# Patient Record
Sex: Female | Born: 1960 | Race: White | Hispanic: No | Marital: Single | Smoking: Never smoker
Health system: Southern US, Community
[De-identification: ages and names within clinical notes are randomized; demographics above are authoritative.]

## PROBLEM LIST (undated history)

## (undated) DIAGNOSIS — F419 Anxiety disorder, unspecified: Secondary | ICD-10-CM

## (undated) DIAGNOSIS — E079 Disorder of thyroid, unspecified: Secondary | ICD-10-CM

## (undated) HISTORY — PX: JOINT REPLACEMENT: SHX530

## (undated) HISTORY — PX: APPENDECTOMY: SHX54

---

## 2012-06-07 ENCOUNTER — Emergency Department (HOSPITAL_BASED_OUTPATIENT_CLINIC_OR_DEPARTMENT_OTHER)
Admission: EM | Admit: 2012-06-07 | Discharge: 2012-06-07 | Disposition: A | Discharge status: Home / Self Care | Payer: No Typology Code available for payment source | Attending: Emergency Medicine | Admitting: Emergency Medicine

## 2012-06-07 ENCOUNTER — Encounter (HOSPITAL_BASED_OUTPATIENT_CLINIC_OR_DEPARTMENT_OTHER): Payer: Self-pay | Admitting: *Deleted

## 2012-06-07 DIAGNOSIS — T148XXA Other injury of unspecified body region, initial encounter: Secondary | ICD-10-CM

## 2012-06-07 DIAGNOSIS — E079 Disorder of thyroid, unspecified: Secondary | ICD-10-CM | POA: Insufficient documentation

## 2012-06-07 DIAGNOSIS — M549 Dorsalgia, unspecified: Secondary | ICD-10-CM | POA: Insufficient documentation

## 2012-06-07 DIAGNOSIS — Z79899 Other long term (current) drug therapy: Secondary | ICD-10-CM | POA: Insufficient documentation

## 2012-06-07 DIAGNOSIS — M542 Cervicalgia: Secondary | ICD-10-CM | POA: Insufficient documentation

## 2012-06-07 DIAGNOSIS — Z885 Allergy status to narcotic agent status: Secondary | ICD-10-CM | POA: Insufficient documentation

## 2012-06-07 DIAGNOSIS — F411 Generalized anxiety disorder: Secondary | ICD-10-CM | POA: Insufficient documentation

## 2012-06-07 HISTORY — DX: Anxiety disorder, unspecified: F41.9

## 2012-06-07 HISTORY — DX: Disorder of thyroid, unspecified: E07.9

## 2012-06-07 MED ORDER — METHOCARBAMOL 500 MG PO TABS: 500.0000 mg | ORAL_TABLET | Freq: Two times a day (BID) | ORAL | Status: AC

## 2012-06-07 MED ORDER — HYDROCODONE-ACETAMINOPHEN 5-325 MG PO TABS
1.0000 | ORAL_TABLET | Freq: Once | ORAL | Status: AC
  Administered 2012-06-07: 1 via ORAL
  Filled 2012-06-07: qty 1

## 2012-06-07 MED ORDER — IBUPROFEN 600 MG PO TABS: 600.0000 mg | ORAL_TABLET | Freq: Four times a day (QID) | ORAL | Status: AC | PRN

## 2012-06-07 MED ORDER — METHOCARBAMOL 500 MG PO TABS
1000.0000 mg | ORAL_TABLET | Freq: Once | ORAL | Status: AC
  Administered 2012-06-07: 1000 mg via ORAL
  Filled 2012-06-07: qty 2

## 2012-06-07 MED ORDER — IBUPROFEN 200 MG PO TABS
600.0000 mg | ORAL_TABLET | Freq: Once | ORAL | Status: AC
  Administered 2012-06-07: 600 mg via ORAL
  Filled 2012-06-07: qty 1
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
## 2012-06-07 NOTE — Discharge Instructions (Signed)
Muscle Strain A muscle strain, or pulled muscle, occurs when a muscle is over-stretched. A small number of muscle fibers may also be torn. This is especially common in athletes. This happens when a sudden violent force placed on a muscle pushes it past its capacity. Usually, recovery from a pulled muscle takes 1 to 2 weeks. But complete healing will take 5 to 6 weeks. There are millions of muscle fibers. Following injury, your body will usually return to normal quickly. HOME CARE INSTRUCTIONS   While awake, apply ice to the sore muscle for 15 to 20 minutes each hour for the first 2 days. Put ice in a plastic bag and place a towel between the bag of ice and your skin.   Do not use the pulled muscle for several days. Do not use the muscle if you have pain.   You may wrap the injured area with an elastic bandage for comfort. Be careful not to bind it too tightly. This may interfere with blood circulation.   Only take over-the-counter or prescription medicines for pain, discomfort, or fever as directed by your caregiver. Do not use aspirin as this will increase bleeding (bruising) at injury site.   Warming up before exercise helps prevent muscle strains.  SEEK MEDICAL CARE IF:  There is increased pain or swelling in the affected area. MAKE SURE YOU:   Understand these instructions.   Will watch your condition.   Will get help right away if you are not doing well or get worse.  Document Released: 11/29/2005 Document Revised: 11/18/2011 Document Reviewed: 06/28/2007 [employer] Patient Information 2012 [employer]. 

## 2015-05-22 ENCOUNTER — Other Ambulatory Visit: Payer: Self-pay | Admitting: Physician Assistant

## 2015-05-22 DIAGNOSIS — Z1231 Encounter for screening mammogram for malignant neoplasm of breast: Secondary | ICD-10-CM

## 2015-06-18 ENCOUNTER — Ambulatory Visit: Payer: Self-pay

## 2015-10-08 ENCOUNTER — Other Ambulatory Visit: Payer: Self-pay

## 2015-10-08 DIAGNOSIS — Z1231 Encounter for screening mammogram for malignant neoplasm of breast: Secondary | ICD-10-CM

## 2015-12-10 ENCOUNTER — Ambulatory Visit
Admission: RE | Admit: 2015-12-10 | Discharge: 2015-12-10 | Disposition: A | Discharge status: Home / Self Care | Payer: 59 | Source: Ambulatory Visit

## 2015-12-10 DIAGNOSIS — Z1231 Encounter for screening mammogram for malignant neoplasm of breast: Secondary | ICD-10-CM

## 2017-10-14 ENCOUNTER — Ambulatory Visit (INDEPENDENT_AMBULATORY_CARE_PROVIDER_SITE_OTHER): Payer: BLUE CROSS/BLUE SHIELD | Admitting: Podiatry

## 2017-10-14 ENCOUNTER — Ambulatory Visit (INDEPENDENT_AMBULATORY_CARE_PROVIDER_SITE_OTHER): Payer: BLUE CROSS/BLUE SHIELD

## 2017-10-14 DIAGNOSIS — M2011 Hallux valgus (acquired), right foot: Secondary | ICD-10-CM

## 2017-10-14 DIAGNOSIS — M216X2 Other acquired deformities of left foot: Secondary | ICD-10-CM | POA: Diagnosis not present

## 2017-10-14 DIAGNOSIS — M79672 Pain in left foot: Secondary | ICD-10-CM | POA: Diagnosis not present

## 2017-10-14 NOTE — Progress Notes (Signed)
Subjective:    Patient ID: Marie Miranda, female    DOB: [birth year], 56 y.o.   MRN: [id number]  HPI 56 year old female presents the office today for concerns of her left second toe "collapsing" and turning and works which is been ongoing since she had bunion surgery her left foot in 2009. She states the pain at times becomes 8/10. She's been taping the toe which does help. She denies any recent injury or trauma to the area. She is also starting to have some discomfort of the bunion on the right side but she does not want to undergo surgery to the right foot. She denies any recent injury or trauma to her feet and she denies any swelling or redness. She has no other concerns today.   Review of Systems  All other systems reviewed and are negative.  Past Medical History:  Diagnosis Date  . Anxiety   . Thyroid disease     Past Surgical History:  Procedure Laterality Date  . APPENDECTOMY    . JOINT REPLACEMENT       Current Outpatient Medications:  .  albuterol (PROVENTIL HFA;VENTOLIN HFA) 108 (90 BASE) MCG/ACT inhaler, Inhale 2 puffs into the lungs every 6 (six) hours as needed., Disp: , Rfl:  .  clonazePAM (KLONOPIN) 0.5 MG tablet, Take 0.5 mg by mouth 2 (two) times daily as needed., Disp: , Rfl:  .  FLUoxetine (PROZAC) 20 MG capsule, Take 20 mg by mouth daily., Disp: , Rfl:  .  fluticasone-salmeterol (ADVAIR HFA) 115-21 MCG/ACT inhaler, Inhale 1 puff into the lungs 2 (two) times daily., Disp: , Rfl:  .  levothyroxine (SYNTHROID, LEVOTHROID) 50 MCG tablet, Take 50 mcg by mouth daily., Disp: , Rfl:   Allergies  Allergen Reactions  . Vicodin [Hydrocodone-Acetaminophen] Other (See Comments)    Medication causes patient to itch.    Social History   Socioeconomic History  . Marital status: Single    Spouse name: Not on file  . Number of children: Not on file  . Years of education: Not on file  . Highest education level: Not on file  Social Needs  . Financial resource  strain: Not on file  . Food insecurity - worry: Not on file  . Food insecurity - inability: Not on file  . Transportation needs - medical: Not on file  . Transportation needs - non-medical: Not on file  Occupational History  . Not on file  Tobacco Use  . Smoking status: Never Smoker  Substance and Sexual Activity  . Alcohol use: No  . Drug use: Not on file  . Sexual activity: Yes    Birth control/protection: None  Other Topics Concern  . Not on file  Social History Narrative  . Not on file        Objective:   Physical Exam  General: AAO x3, NAD  Dermatological: Skin is warm, dry and supple bilateral. Nails x 10 are well manicured; remaining integument appears unremarkable at this time. There are no open sores, no preulcerative lesions, no rash or signs of infection present.  Vascular: Dorsalis Pedis artery and Posterior Tibial artery pedal pulses are 2/4 bilateral with immedate capillary fill time There is no pain with calf compression, swelling, warmth, erythema.   Neruologic: Grossly intact via light touch bilateral.  Protective threshold with Semmes Wienstein monofilament intact to all pedal sites bilateral.   Musculoskeletal: Hallux is in rectus position. There is prominence the second metatarsal head plantarly in the left foot with mild to  palpation. The second toe is sitting deviated in the transverse plane as well. Subjective tenderness to palpation submetatarsal 2 into the second toe on the left foot. No pain bunion surgery site. On the right foot there is mild HAV present mild transverse plane deformity to the second digit as well. There is no other areas of tenderness of the foot at this time. Muscular strength 5/5 in all groups tested bilateral.  Gait: Unassisted, Nonantalgic.     Assessment & Plan:  56 year old female with prominent second metatarsal head, elongated second metatarsal with transverse plane deformity; mild bunion right foot -Treatment options  discussed including all alternatives, risks, and complications -Etiology of symptoms were discussed -X-rays were obtained and reviewed with the patient. Discussed elongated second metatarsal. There is no evidence of acute fracture identified. Deformity present to the second digit. The long discussion regards the left foot in regards to both conservative as well as surgical treatment options. Conservatively we discussed a custom insert with a modification to offload the second metatarsal head. Discussed supportive shoes and change in shoes. Surgically we discussed second metatarsal shortening osteotomy to help decrease the prominence of the metatarsal to help shorten metatarsal. She denies twice a day of doing an arthrodesis or arthroplasty of the second digit I think we can get by with pinning of the second toe through the MPJ. Discussed the surgery as well as postoperative course. She was a think about her options. -Regards the mild bunion right foot discussed shoe gear modifications, inserts, offloading padding.  Ovid CurdMatthew Wagoner, DPM -

## 2017-10-14 NOTE — Patient Instructions (Signed)

## 2018-08-23 ENCOUNTER — Other Ambulatory Visit: Payer: Self-pay | Admitting: Family Medicine

## 2018-08-23 DIAGNOSIS — Z1231 Encounter for screening mammogram for malignant neoplasm of breast: Secondary | ICD-10-CM

## 2018-09-19 ENCOUNTER — Ambulatory Visit
Admission: RE | Admit: 2018-09-19 | Discharge: 2018-09-19 | Disposition: A | Discharge status: Home / Self Care | Payer: BLUE CROSS/BLUE SHIELD | Source: Ambulatory Visit | Attending: Family Medicine | Admitting: Family Medicine

## 2018-09-19 DIAGNOSIS — Z1231 Encounter for screening mammogram for malignant neoplasm of breast: Secondary | ICD-10-CM

## 2019-10-26 ENCOUNTER — Other Ambulatory Visit: Payer: Self-pay | Admitting: Family Medicine

## 2019-10-26 DIAGNOSIS — Z1231 Encounter for screening mammogram for malignant neoplasm of breast: Secondary | ICD-10-CM

## 2019-11-19 ENCOUNTER — Ambulatory Visit
Admission: RE | Admit: 2019-11-19 | Discharge: 2019-11-19 | Disposition: A | Discharge status: Home / Self Care | Payer: BLUE CROSS/BLUE SHIELD | Source: Ambulatory Visit | Attending: Family Medicine | Admitting: Family Medicine

## 2019-11-19 ENCOUNTER — Other Ambulatory Visit: Payer: Self-pay

## 2019-11-19 DIAGNOSIS — Z1231 Encounter for screening mammogram for malignant neoplasm of breast: Secondary | ICD-10-CM

## 2020-05-15 DIAGNOSIS — E039 Hypothyroidism, unspecified: Secondary | ICD-10-CM | POA: Diagnosis not present

## 2020-07-28 ENCOUNTER — Other Ambulatory Visit: Payer: Self-pay

## 2020-07-28 ENCOUNTER — Emergency Department (HOSPITAL_COMMUNITY): Payer: BLUE CROSS/BLUE SHIELD

## 2020-07-28 ENCOUNTER — Emergency Department (HOSPITAL_COMMUNITY)
Admission: EM | Admit: 2020-07-28 | Discharge: 2020-07-28 | Disposition: A | Discharge status: Home / Self Care | Payer: BLUE CROSS/BLUE SHIELD | Attending: Emergency Medicine | Admitting: Emergency Medicine

## 2020-07-28 DIAGNOSIS — S0990XA Unspecified injury of head, initial encounter: Secondary | ICD-10-CM | POA: Diagnosis not present

## 2020-07-28 DIAGNOSIS — R0602 Shortness of breath: Secondary | ICD-10-CM | POA: Diagnosis not present

## 2020-07-28 DIAGNOSIS — R404 Transient alteration of awareness: Secondary | ICD-10-CM | POA: Diagnosis not present

## 2020-07-28 DIAGNOSIS — R42 Dizziness and giddiness: Secondary | ICD-10-CM | POA: Insufficient documentation

## 2020-07-28 DIAGNOSIS — T7840XA Allergy, unspecified, initial encounter: Secondary | ICD-10-CM | POA: Insufficient documentation

## 2020-07-28 DIAGNOSIS — R55 Syncope and collapse: Secondary | ICD-10-CM | POA: Diagnosis not present

## 2020-07-28 DIAGNOSIS — R410 Disorientation, unspecified: Secondary | ICD-10-CM | POA: Diagnosis not present

## 2020-07-28 DIAGNOSIS — H748X1 Other specified disorders of right middle ear and mastoid: Secondary | ICD-10-CM | POA: Diagnosis not present

## 2020-07-28 LAB — CBC WITH DIFFERENTIAL/PLATELET
Abs Immature Granulocytes: 0.02 10*3/uL (ref 0.00–0.07)
Basophils Absolute: 0 10*3/uL (ref 0.0–0.1)
Basophils Relative: 0 %
Eosinophils Absolute: 0 10*3/uL (ref 0.0–0.5)
Eosinophils Relative: 0 %
HCT: 40.6 % (ref 36.0–46.0)
Hemoglobin: 13.9 g/dL (ref 12.0–15.0)
Immature Granulocytes: 0 %
Lymphocytes Relative: 9 %
Lymphs Abs: 0.9 10*3/uL (ref 0.7–4.0)
MCH: 32.3 pg (ref 26.0–34.0)
MCHC: 34.2 g/dL (ref 30.0–36.0)
MCV: 94.4 fL (ref 80.0–100.0)
Monocytes Absolute: 0.7 10*3/uL (ref 0.1–1.0)
Monocytes Relative: 8 %
Neutro Abs: 7.8 10*3/uL — ABNORMAL HIGH (ref 1.7–7.7)
Neutrophils Relative %: 83 %
Platelets: 265 10*3/uL (ref 150–400)
RBC: 4.3 MIL/uL (ref 3.87–5.11)
RDW: 11.4 % — ABNORMAL LOW (ref 11.5–15.5)
WBC: 9.5 10*3/uL (ref 4.0–10.5)
nRBC: 0 % (ref 0.0–0.2)

## 2020-07-28 LAB — URINALYSIS, ROUTINE W REFLEX MICROSCOPIC
Bilirubin Urine: NEGATIVE
Glucose, UA: NEGATIVE mg/dL
Hgb urine dipstick: NEGATIVE
Ketones, ur: NEGATIVE mg/dL
Leukocytes,Ua: NEGATIVE
Nitrite: NEGATIVE
Protein, ur: 30 mg/dL — AB
Specific Gravity, Urine: 1.017 (ref 1.005–1.030)
pH: 6 (ref 5.0–8.0)

## 2020-07-28 LAB — BASIC METABOLIC PANEL
Anion gap: 11 (ref 5–15)
BUN: 12 mg/dL (ref 6–20)
CO2: 25 mmol/L (ref 22–32)
Calcium: 8.8 mg/dL — ABNORMAL LOW (ref 8.9–10.3)
Chloride: 98 mmol/L (ref 98–111)
Creatinine, Ser: 0.87 mg/dL (ref 0.44–1.00)
GFR calc Af Amer: >60 mL/min (ref >60)
GFR calc non Af Amer: >60 mL/min (ref >60)
Glucose, Bld: 120 mg/dL — ABNORMAL HIGH (ref 70–99)
Potassium: 4.1 mmol/L (ref 3.5–5.1)
Sodium: 134 mmol/L — ABNORMAL LOW (ref 135–145)

## 2020-07-28 MED ORDER — EPINEPHRINE 0.3 MG/0.3ML IJ SOAJ: 0.3000 mg | INTRAMUSCULAR | 0 refills | Status: AC | PRN

## 2020-07-28 MED ORDER — FAMOTIDINE IN NACL 20-0.9 MG/50ML-% IV SOLN
20.0000 mg | Freq: Once | INTRAVENOUS | Status: AC
  Administered 2020-07-28: 20 mg via INTRAVENOUS
  Filled 2020-07-28: qty 50

## 2020-07-28 MED ORDER — METHYLPREDNISOLONE SODIUM SUCC 125 MG IJ SOLR
125.0000 mg | Freq: Once | INTRAMUSCULAR | Status: AC
  Administered 2020-07-28: 125 mg via INTRAVENOUS
  Filled 2020-07-28: qty 2

## 2020-07-28 NOTE — ED Triage Notes (Signed)
Pt presents via EMS from UC c/o allergic reaction. Pt given IM Epi x2 and IM Benadryl PTA. Pt A&Ox4.

## 2020-07-28 NOTE — ED Provider Notes (Signed)
[facility] EMERGENCY DEPARTMENT Provider Note   CSN: [id number] Arrival date & time: 07/28/20  1958     History Chief Complaint  Patient presents with   Allergic Reaction    Marie Miranda is a 59 y.o. female with PMHx anxiety and thyroid disease who presents to the ED today via EMS with complaint of allergic reaction. Per EMS pt ate a hamburger today around 2 PM and shortly afterwards began having diffuse itching and hives to her body. She took a claritin at home without relief prompting her to call her friend to come pick her up and take her to UC. She reports that while at home she "passed out" and fell to the ground and was out for a couple of seconds before coming to. She believes she hit her head on the floor; she is not anticoagulated. No complaints of headache currently. Pt reports she believes she passed out while on the way to urgent care as well however she is unsure if she truly passed out or not. States that she got very dizzy and lightheaded and could feel like she was going to pass out. No shortness of breath or chest pain.   Additional information obtained by friend whom drove patient to the UC - reports they live about 1-2 miles from the UC. Pt was doing fine and mentating well when he picked her up however shortly into the ride she "slumped over" and leaned on the driver's shoulder. When they got to UC he went to the side of the car to help her out; reports that 2 steps out of the car pt slumped over again however he was able to catch her. Friend does not believe pt fully passed out at any time. Per EMS pt was provided with benadryl and fluids at [facility] and then EMS was called given severity of patient's hives. EMS gave pt 2 rounds of epinephrine with improvement in her sx.   The history is provided by the patient, medical records, a friend and the EMS personnel.       Past Medical History:  Diagnosis Date   Anxiety    Thyroid disease     There  are no problems to display for this patient.   Past Surgical History:  Procedure Laterality Date   APPENDECTOMY     JOINT REPLACEMENT       OB History   No obstetric history on file.     Family History  Problem Relation Age of Onset   Breast cancer Paternal Grandmother 34    Social History   Tobacco Use   Smoking status: Never Smoker  Substance Use Topics   Alcohol use: No   Drug use: Not on file    Home Medications Prior to Admission medications   Medication Sig Start Date End Date Taking? Authorizing Provider  albuterol (PROVENTIL HFA;VENTOLIN HFA) 108 (90 BASE) MCG/ACT inhaler Inhale 2 puffs into the lungs every 6 (six) hours as needed for wheezing or shortness of breath.    Yes [provider]  clonazePAM (KLONOPIN) 0.5 MG tablet Take 0.5 mg by mouth 2 (two) times daily as needed for anxiety.    Yes [provider]  fexofenadine (ALLEGRA) 60 MG tablet Take 60 mg by mouth 2 (two) times daily.   Yes [provider]  FLUoxetine (PROZAC) 20 MG capsule Take 20 mg by mouth daily.   Yes [provider]  fluticasone (FLONASE) 50 MCG/ACT nasal spray Place 1 spray into both  nostrils daily. 05/15/20  Yes [provider]  fluticasone-salmeterol (ADVAIR HFA) 115-21 MCG/ACT inhaler Inhale 1 puff into the lungs as needed (breathing).    Yes [provider]  gabapentin (NEURONTIN) 300 MG capsule Take 300 mg by mouth as needed (back pain).  05/17/20  Yes [provider]  levothyroxine (SYNTHROID, LEVOTHROID) 50 MCG tablet Take 50 mcg by mouth daily.   Yes [provider]  magnesium 30 MG tablet Take 30 mg by mouth 2 (two) times daily.   Yes [provider]  thiamine (VITAMIN B-1) 100 MG tablet Take 100 mg by mouth daily.   Yes [provider]  EPINEPHrine 0.3 mg/0.3 mL IJ SOAJ injection Inject 0.3 mLs (0.3 mg total) into the muscle as needed for anaphylaxis. 07/28/20   Tanda Rockers, PA-C     Allergies    Vicodin [hydrocodone-acetaminophen]  Review of Systems   Review of Systems  Respiratory: Negative for shortness of breath.   Cardiovascular: Negative for chest pain.  Skin: Positive for rash.  Neurological: Positive for dizziness and light-headedness.  All other systems reviewed and are negative.   Physical Exam Updated Vital Signs BP 109/72    Pulse 73    Temp 98.1 F (36.7 C) (Oral)    Resp 16    SpO2 92%   Physical Exam Vitals and nursing note reviewed.  Constitutional:      Appearance: She is not ill-appearing or diaphoretic.     Comments: Pt shivering on arrival  HENT:     Head: Normocephalic.     Comments: Smell hematoma to occiput with mild TTP    Mouth/Throat:     Mouth: Mucous membranes are moist.     Pharynx: No oropharyngeal exudate or posterior oropharyngeal erythema.     Comments: Uvula is midline. Airway patent.  Eyes:     Extraocular Movements: Extraocular movements intact.     Conjunctiva/sclera: Conjunctivae normal.     Pupils: Pupils are equal, round, and reactive to light.  Cardiovascular:     Rate and Rhythm: Normal rate and regular rhythm.     Pulses: Normal pulses.  Pulmonary:     Effort: Pulmonary effort is normal.     Breath sounds: Normal breath sounds. No wheezing, rhonchi or rales.  Abdominal:     Palpations: Abdomen is soft.     Tenderness: There is no abdominal tenderness.  Musculoskeletal:     Cervical back: Neck supple. No tenderness.  Skin:    General: Skin is warm and dry.     Findings: Rash present.     Comments: Diffuse red urticarial rash (improving per EMS)  Neurological:     General: No focal deficit present.     Mental Status: She is alert and oriented to person, place, and time. Mental status is at baseline.     Cranial Nerves: No cranial nerve deficit.     ED Results / Procedures / Treatments   Labs (all labs ordered are listed, but only abnormal results are displayed) Labs Reviewed  BASIC METABOLIC  PANEL - Abnormal; Notable for the following components:      Result Value   Sodium 134 (*)    Glucose, Bld 120 (*)    Calcium 8.8 (*)    All other components within normal limits  CBC WITH DIFFERENTIAL/PLATELET - Abnormal; Notable for the following components:   RDW 11.4 (*)    Neutro Abs 7.8 (*)    All other components within normal limits  URINALYSIS, ROUTINE W  REFLEX MICROSCOPIC - Abnormal; Notable for the following components:   Protein, ur 30 (*)    Bacteria, UA RARE (*)    All other components within normal limits    EKG EKG Interpretation  Date/Time:  Monday July 28 2020 20:32:41 EDT Ventricular Rate:  85 PR Interval:    QRS Duration: 108 QT Interval:  404 QTC Calculation: 481 R Axis:   27 Text Interpretation: Sinus rhythm RSR' in V1 or V2, right VCD or RVH Borderline T abnormalities, anterior leads Baseline wander in lead(s) V6 No old tracing to compare Confirmed by Susy FrizzleSheldon, Charles [phone]) on 07/28/2020 10:32:09 PM   Radiology CT Head Wo Contrast  Result Date: 07/28/2020 CLINICAL DATA:  Syncope, fall striking head. EXAM: CT HEAD WITHOUT CONTRAST TECHNIQUE: Contiguous axial images were obtained from the base of the skull through the vertex without intravenous contrast. COMPARISON:  None. FINDINGS: Brain: Brain volume is normal for age. No intracranial hemorrhage, mass effect, or midline shift. No hydrocephalus. The basilar cisterns are patent. No evidence of territorial infarct or acute ischemia. No extra-axial or intracranial fluid collection. Vascular: No hyperdense vessel or unexpected calcification. Skull: No fracture or focal lesion. Sinuses/Orbits: Paranasal sinuses and mastoid air cells are clear. Right mastoid air cells are hypo pneumatized. The visualized orbits are unremarkable. Other: None. IMPRESSION: Negative noncontrast head CT. Electronically Signed   By: Narda RutherfordMelanie  Sanford M.D.   On: 07/28/2020 21:55    Procedures Procedures (including critical care  time)  Medications Ordered in ED Medications  methylPREDNISolone sodium succinate (SOLU-MEDROL) 125 mg/2 mL injection 125 mg (125 mg Intravenous Given 07/28/20 2026)  famotidine (PEPCID) IVPB 20 mg premix (0 mg Intravenous Stopped 07/28/20 2239)    ED Course  I have reviewed the triage vital signs and the nursing notes.  Pertinent labs & imaging results that were available during my care of the patient were reviewed by me and considered in my medical decision making (see chart for details).    MDM Rules/Calculators/A&P                          59 year old female who presents to the ED via EMS for allergic reaction. Began having hives after eating hamburger earlier today at work what appears to be 2-3 near syncopal episodes.  Patient is unsure if she fully passed out and from friends recollection does sound more like near syncope.  Patient states she could feel dizziness and lightheadedness prior to.  No chest pain or shortness of breath.  Patient was given Benadryl and fluids with urgent care and then 2 rounds of epinephrine with EMS.  On arrival to the ED patient is afebrile, nontachycardic nontachypneic.  She appears to be in no acute distress.  She is shivering and stating that she is cold.  Temp normal 98.1.  Patient does have an urticarial rash diffusely however per EMS they report that this has been improving since being en route.  Patient denies any shortness of breath or throat/lip swelling.  Airway intact.  Given questionable syncope versus near syncope will obtain labs, orthostatics, EKG.  Given patient hit her head will obtain CT head at this time.  We will continue to monitor in the ED for a couple of hours given she received 2 rounds of epinephrine.  She does report that she has been experiencing issues with hives in the past however it is never been this severe.  She does have an scheduled appointment with allergist next week,  advised to keep.   Orthostatics negative.  EKG without  acute ischemic changes.  CBC without leukocytosis. Hgb stable at 13.9.  BMP with sodium 134; pt did receive fluids with EMS. Glucose 120. No other findings.  U/A negative  Pt was monitored in the ED for an adequate amount of time after receiving epinephrine; she is stable for discharge home at this time. Have prescribed epi pen for pt. She is instructed to return to the ED IMMEDIATELY for any worsening symptoms.   This note was prepared using Dragon voice recognition software and may include unintentional dictation errors due to the inherent limitations of voice recognition software.   Final Clinical Impression(s) / ED Diagnoses Final diagnoses:  Allergic reaction, initial encounter    Rx / DC Orders ED Discharge Orders         Ordered    EPINEPHrine 0.3 mg/0.3 mL IJ SOAJ injection  As needed     Discontinue  Reprint     07/28/20 2236           Discharge Instructions     Please pick up epi pen to carry with you at all times in case you have another allergic reaction  Keep appointment with allergist as scheduled for next week Return to the ED IMMEDIATELY for any worsening symptoms including return of hives, shortness of breath, chest pain, passing out, coughing up blood, or any other new/concerning symptoms       Tanda Rockers, PA-C 07/28/20 2259    Pollyann Savoy, MD 07/28/20 2329

## 2020-07-28 NOTE — Discharge Instructions (Signed)
Please pick up epi pen to carry with you at all times in case you have another allergic reaction  Keep appointment with allergist as scheduled for next week Return to the ED IMMEDIATELY for any worsening symptoms including return of hives, shortness of breath, chest pain, passing out, coughing up blood, or any other new/concerning symptoms

## 2020-09-19 DIAGNOSIS — Z91018 Allergy to other foods: Secondary | ICD-10-CM | POA: Diagnosis not present

## 2020-09-19 DIAGNOSIS — J301 Allergic rhinitis due to pollen: Secondary | ICD-10-CM | POA: Diagnosis not present

## 2020-09-19 DIAGNOSIS — J3089 Other allergic rhinitis: Secondary | ICD-10-CM | POA: Diagnosis not present

## 2020-09-19 DIAGNOSIS — J3081 Allergic rhinitis due to animal (cat) (dog) hair and dander: Secondary | ICD-10-CM | POA: Diagnosis not present

## 2020-09-29 DIAGNOSIS — M47816 Spondylosis without myelopathy or radiculopathy, lumbar region: Secondary | ICD-10-CM | POA: Diagnosis not present

## 2020-09-29 DIAGNOSIS — M545 Low back pain, unspecified: Secondary | ICD-10-CM | POA: Diagnosis not present

## 2020-10-03 DIAGNOSIS — M5136 Other intervertebral disc degeneration, lumbar region: Secondary | ICD-10-CM | POA: Diagnosis not present

## 2020-10-03 DIAGNOSIS — M47816 Spondylosis without myelopathy or radiculopathy, lumbar region: Secondary | ICD-10-CM | POA: Diagnosis not present

## 2020-10-03 DIAGNOSIS — M545 Low back pain, unspecified: Secondary | ICD-10-CM | POA: Diagnosis not present

## 2020-10-06 DIAGNOSIS — M5416 Radiculopathy, lumbar region: Secondary | ICD-10-CM | POA: Diagnosis not present

## 2020-10-28 DIAGNOSIS — M5416 Radiculopathy, lumbar region: Secondary | ICD-10-CM | POA: Diagnosis not present

## 2020-10-28 DIAGNOSIS — M5136 Other intervertebral disc degeneration, lumbar region: Secondary | ICD-10-CM | POA: Diagnosis not present

## 2020-10-28 DIAGNOSIS — M47816 Spondylosis without myelopathy or radiculopathy, lumbar region: Secondary | ICD-10-CM | POA: Diagnosis not present

## 2020-11-03 DIAGNOSIS — M5136 Other intervertebral disc degeneration, lumbar region: Secondary | ICD-10-CM | POA: Diagnosis not present

## 2020-11-03 DIAGNOSIS — M5416 Radiculopathy, lumbar region: Secondary | ICD-10-CM | POA: Diagnosis not present

## 2021-02-12 DIAGNOSIS — Z23 Encounter for immunization: Secondary | ICD-10-CM | POA: Diagnosis not present

## 2021-02-12 DIAGNOSIS — Z0000 Encounter for general adult medical examination without abnormal findings: Secondary | ICD-10-CM | POA: Diagnosis not present

## 2021-02-12 DIAGNOSIS — Z124 Encounter for screening for malignant neoplasm of cervix: Secondary | ICD-10-CM | POA: Diagnosis not present

## 2021-02-12 DIAGNOSIS — M549 Dorsalgia, unspecified: Secondary | ICD-10-CM | POA: Diagnosis not present

## 2021-02-12 DIAGNOSIS — M5137 Other intervertebral disc degeneration, lumbosacral region: Secondary | ICD-10-CM | POA: Diagnosis not present

## 2021-02-12 DIAGNOSIS — F339 Major depressive disorder, recurrent, unspecified: Secondary | ICD-10-CM | POA: Diagnosis not present

## 2021-02-12 DIAGNOSIS — Z13220 Encounter for screening for lipoid disorders: Secondary | ICD-10-CM | POA: Diagnosis not present

## 2021-02-12 DIAGNOSIS — E039 Hypothyroidism, unspecified: Secondary | ICD-10-CM | POA: Diagnosis not present

## 2021-03-04 DIAGNOSIS — Z124 Encounter for screening for malignant neoplasm of cervix: Secondary | ICD-10-CM | POA: Diagnosis not present

## 2021-10-01 DIAGNOSIS — M180 Bilateral primary osteoarthritis of first carpometacarpal joints: Secondary | ICD-10-CM | POA: Diagnosis not present

## 2021-11-16 DIAGNOSIS — J208 Acute bronchitis due to other specified organisms: Secondary | ICD-10-CM | POA: Diagnosis not present

## 2021-12-18 DIAGNOSIS — R059 Cough, unspecified: Secondary | ICD-10-CM | POA: Diagnosis not present

## 2021-12-18 DIAGNOSIS — J45909 Unspecified asthma, uncomplicated: Secondary | ICD-10-CM | POA: Diagnosis not present

## 2021-12-25 IMAGING — CT CT HEAD W/O CM
4 series · 15 of 47 positions shown, 17 images · non-contrast
Comparison: None.

CLINICAL DATA: Syncope, fall striking head.

EXAM:
CT HEAD WITHOUT CONTRAST
TECHNIQUE: Contiguous axial images were obtained from the base of the skull
through the vertex without intravenous contrast.

[Series 3: head without · axial · non-contrast · 0.42mm/px · z∈[-153,-33]mm · 7 of 32 slices shown, 9 images]
[im 4/32  brain]
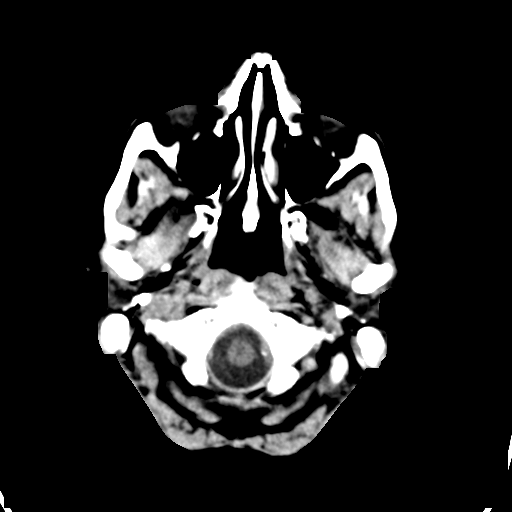
[im 4/32  bone]
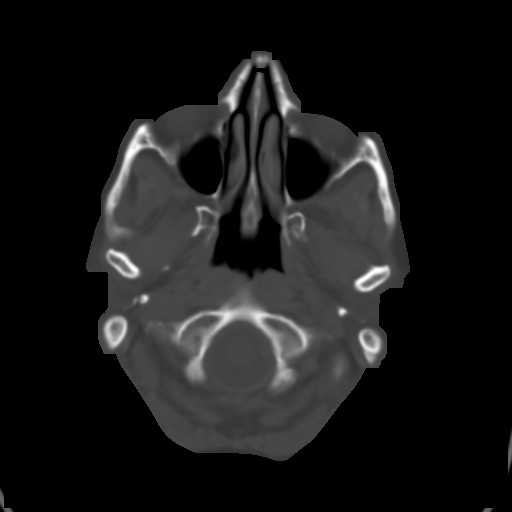
[im 8/32  brain]
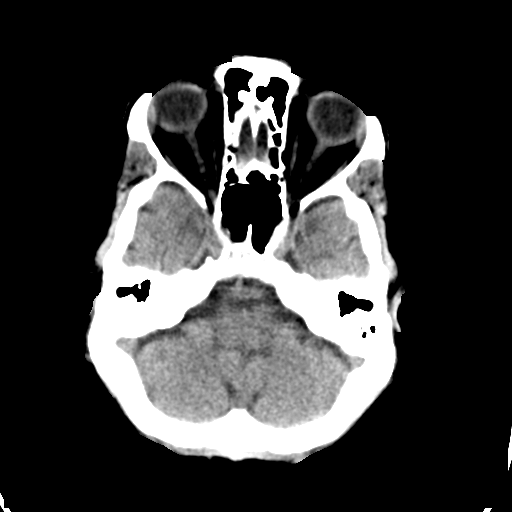
[im 12/32  brain]
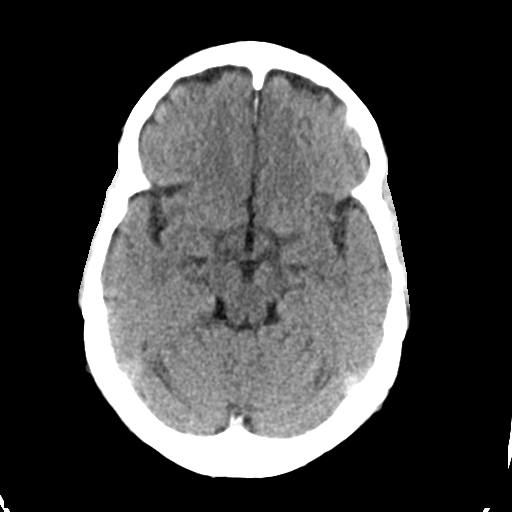
[im 16/32  brain]
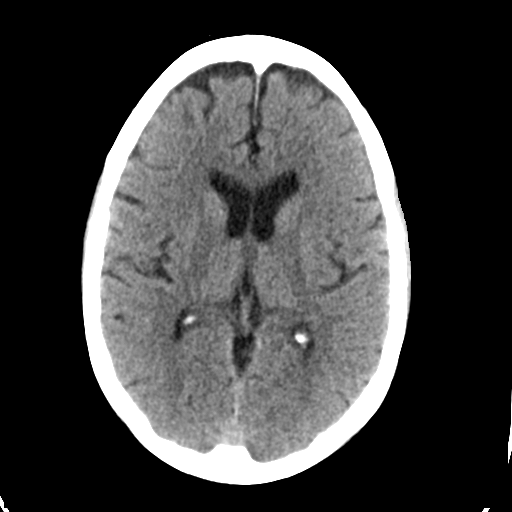
[im 20/32  brain]
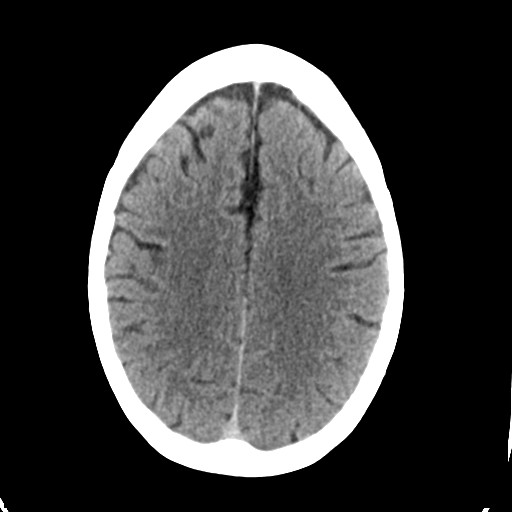
[im 20/32  bone]
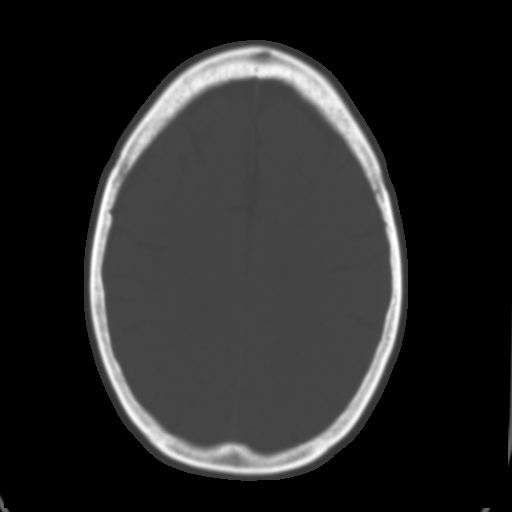
[im 24/32  brain]
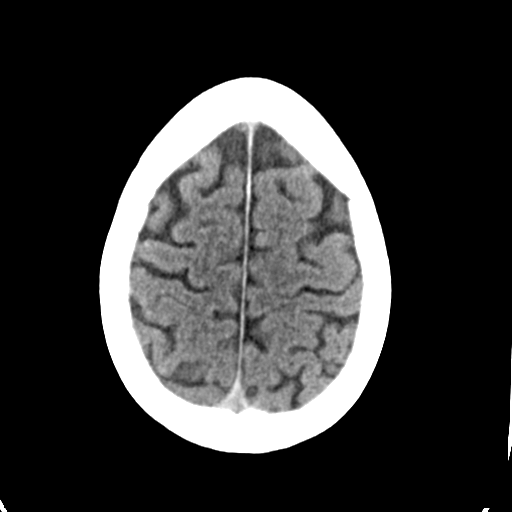
[im 28/32  brain]
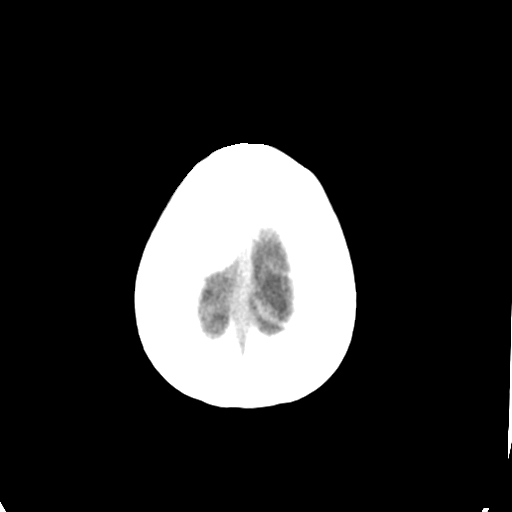

[Series 4: head bone · axial · 0.42mm/px · z∈[-154,-138]mm · 2 of 79 slices shown]
[im 8/79  bone]
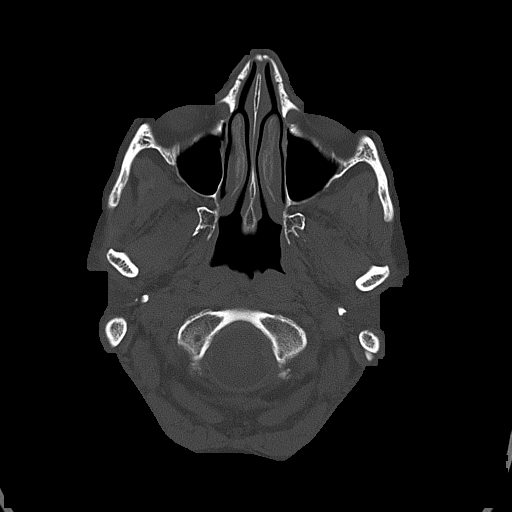
[im 16/79  bone]
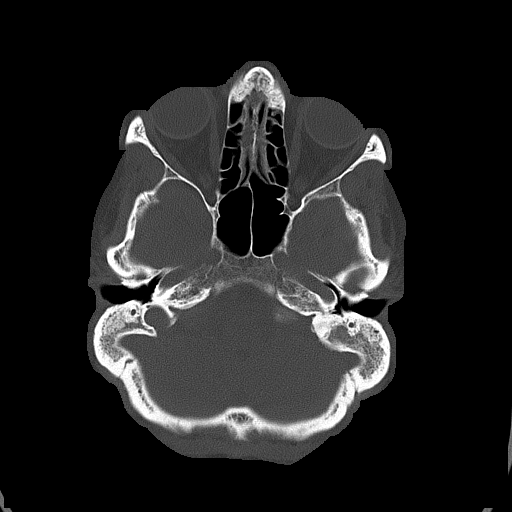

[Series 5: head without cor · coronal · non-contrast · 0.30mm/px · 3 of 70 slices shown]
[im 24/70  brain]
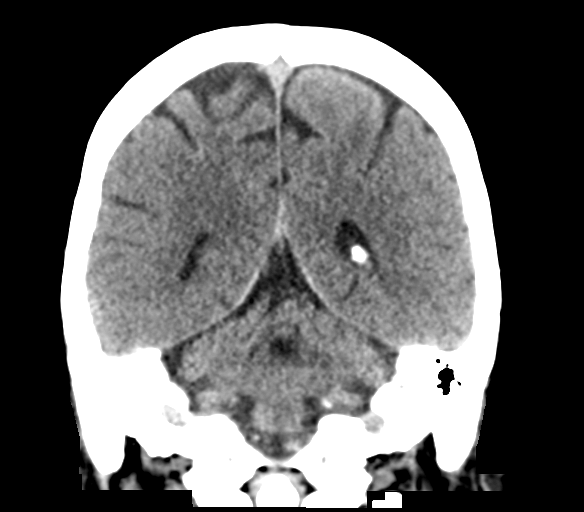
[im 31/70  brain]
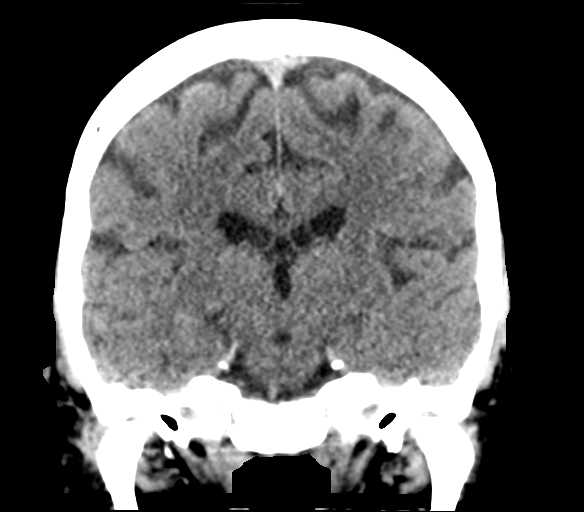
[im 39/70  brain]
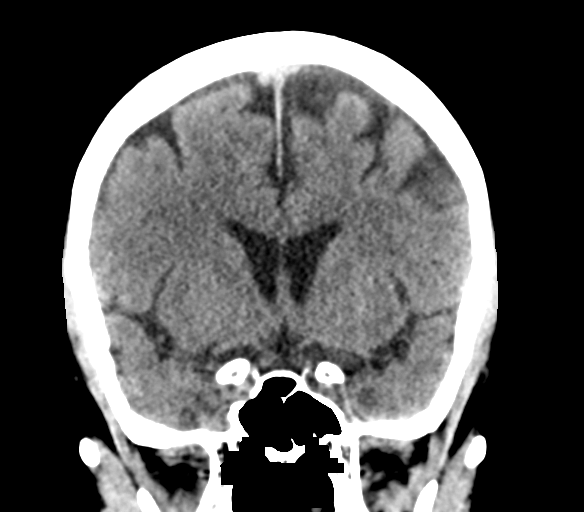

[Series 6: head without sag · sagittal · non-contrast · 0.30mm/px · 3 of 63 slices shown]
[im 21/63  brain]
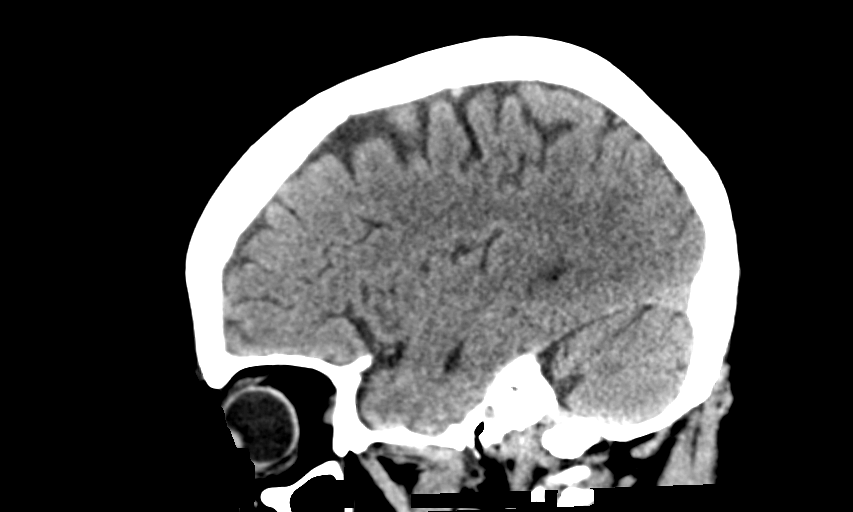
[im 32/63  brain]
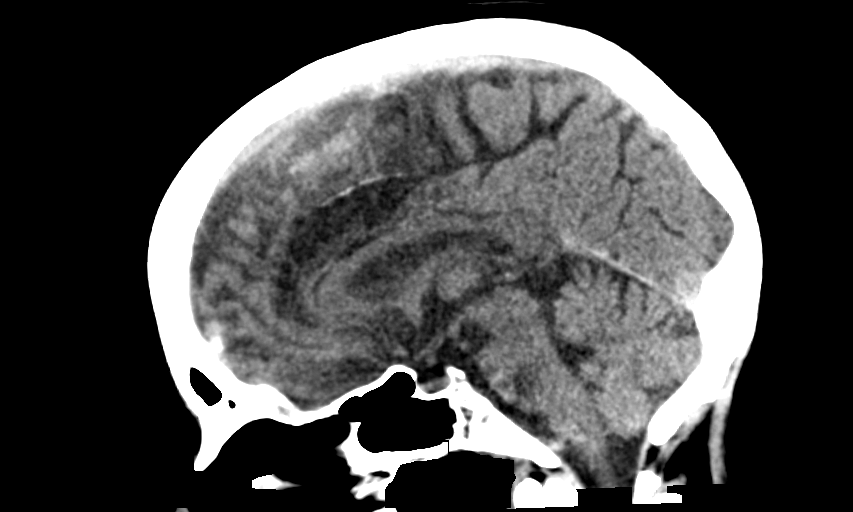
[im 42/63  brain]
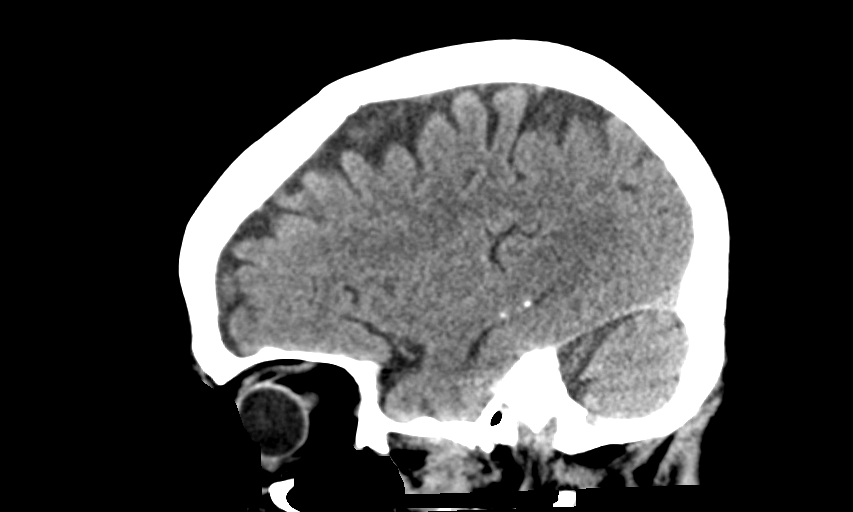

[15 of 47 positions shown; findings below may reference images not displayed]

FINDINGS: Brain: Brain volume is normal for age. No intracranial hemorrhage,
mass effect, or midline shift. No hydrocephalus. The basilar
cisterns are patent. No evidence of territorial infarct or acute
ischemia. No extra-axial or intracranial fluid collection.

Vascular: No hyperdense vessel or unexpected calcification.

Skull: No fracture or focal lesion.

Sinuses/Orbits: Paranasal sinuses and mastoid air cells are clear.
Right mastoid air cells are hypo pneumatized. The visualized orbits
are unremarkable.

Other: None.
IMPRESSION: Negative noncontrast head CT.

## 2022-02-18 DIAGNOSIS — E7800 Pure hypercholesterolemia, unspecified: Secondary | ICD-10-CM | POA: Diagnosis not present

## 2022-02-18 DIAGNOSIS — E039 Hypothyroidism, unspecified: Secondary | ICD-10-CM | POA: Diagnosis not present

## 2022-02-18 DIAGNOSIS — Z131 Encounter for screening for diabetes mellitus: Secondary | ICD-10-CM | POA: Diagnosis not present

## 2022-02-18 DIAGNOSIS — Z0000 Encounter for general adult medical examination without abnormal findings: Secondary | ICD-10-CM | POA: Diagnosis not present

## 2022-02-18 DIAGNOSIS — F339 Major depressive disorder, recurrent, unspecified: Secondary | ICD-10-CM | POA: Diagnosis not present

## 2022-02-18 DIAGNOSIS — M5137 Other intervertebral disc degeneration, lumbosacral region: Secondary | ICD-10-CM | POA: Diagnosis not present

## 2022-03-02 ENCOUNTER — Ambulatory Visit
Admission: RE | Admit: 2022-03-02 | Discharge: 2022-03-02 | Disposition: A | Discharge status: Home / Self Care | Payer: BC Managed Care – PPO | Source: Ambulatory Visit | Attending: Physician Assistant | Admitting: Physician Assistant

## 2022-03-02 ENCOUNTER — Other Ambulatory Visit: Payer: Self-pay | Admitting: Physician Assistant

## 2022-03-02 ENCOUNTER — Other Ambulatory Visit: Payer: Self-pay

## 2022-03-02 DIAGNOSIS — Z1231 Encounter for screening mammogram for malignant neoplasm of breast: Secondary | ICD-10-CM | POA: Diagnosis not present

## 2022-05-28 DIAGNOSIS — M549 Dorsalgia, unspecified: Secondary | ICD-10-CM | POA: Diagnosis not present

## 2022-05-28 DIAGNOSIS — R0982 Postnasal drip: Secondary | ICD-10-CM | POA: Diagnosis not present

## 2022-05-28 DIAGNOSIS — J029 Acute pharyngitis, unspecified: Secondary | ICD-10-CM | POA: Diagnosis not present

## 2022-06-09 DIAGNOSIS — F411 Generalized anxiety disorder: Secondary | ICD-10-CM | POA: Diagnosis not present

## 2022-06-09 DIAGNOSIS — F064 Anxiety disorder due to known physiological condition: Secondary | ICD-10-CM | POA: Diagnosis not present

## 2023-01-21 DIAGNOSIS — R051 Acute cough: Secondary | ICD-10-CM | POA: Diagnosis not present

## 2023-02-10 DIAGNOSIS — M180 Bilateral primary osteoarthritis of first carpometacarpal joints: Secondary | ICD-10-CM | POA: Diagnosis not present

## 2023-02-10 DIAGNOSIS — M1811 Unilateral primary osteoarthritis of first carpometacarpal joint, right hand: Secondary | ICD-10-CM | POA: Diagnosis not present

## 2023-02-25 DIAGNOSIS — E039 Hypothyroidism, unspecified: Secondary | ICD-10-CM | POA: Diagnosis not present

## 2023-02-25 DIAGNOSIS — F325 Major depressive disorder, single episode, in full remission: Secondary | ICD-10-CM | POA: Diagnosis not present

## 2023-02-25 DIAGNOSIS — Z0000 Encounter for general adult medical examination without abnormal findings: Secondary | ICD-10-CM | POA: Diagnosis not present

## 2023-02-25 DIAGNOSIS — Z1211 Encounter for screening for malignant neoplasm of colon: Secondary | ICD-10-CM | POA: Diagnosis not present

## 2023-02-25 DIAGNOSIS — Z1330 Encounter for screening examination for mental health and behavioral disorders, unspecified: Secondary | ICD-10-CM | POA: Diagnosis not present

## 2023-02-25 DIAGNOSIS — Z13220 Encounter for screening for lipoid disorders: Secondary | ICD-10-CM | POA: Diagnosis not present

## 2023-02-25 DIAGNOSIS — Z91018 Allergy to other foods: Secondary | ICD-10-CM | POA: Diagnosis not present

## 2023-03-14 DIAGNOSIS — H10811 Pingueculitis, right eye: Secondary | ICD-10-CM | POA: Diagnosis not present

## 2023-03-30 DIAGNOSIS — M47816 Spondylosis without myelopathy or radiculopathy, lumbar region: Secondary | ICD-10-CM | POA: Diagnosis not present

## 2023-04-07 DIAGNOSIS — M47816 Spondylosis without myelopathy or radiculopathy, lumbar region: Secondary | ICD-10-CM | POA: Diagnosis not present

## 2023-05-04 DIAGNOSIS — M47816 Spondylosis without myelopathy or radiculopathy, lumbar region: Secondary | ICD-10-CM | POA: Diagnosis not present

## 2023-05-10 DIAGNOSIS — M47816 Spondylosis without myelopathy or radiculopathy, lumbar region: Secondary | ICD-10-CM | POA: Diagnosis not present

## 2023-06-14 DIAGNOSIS — M47816 Spondylosis without myelopathy or radiculopathy, lumbar region: Secondary | ICD-10-CM | POA: Diagnosis not present

## 2023-09-22 DIAGNOSIS — M47816 Spondylosis without myelopathy or radiculopathy, lumbar region: Secondary | ICD-10-CM | POA: Diagnosis not present

## 2023-09-22 DIAGNOSIS — Z6821 Body mass index (BMI) 21.0-21.9, adult: Secondary | ICD-10-CM | POA: Diagnosis not present

## 2023-09-22 DIAGNOSIS — M5416 Radiculopathy, lumbar region: Secondary | ICD-10-CM | POA: Diagnosis not present

## 2023-10-05 ENCOUNTER — Other Ambulatory Visit: Payer: Self-pay | Admitting: Orthopaedic Surgery

## 2023-10-05 DIAGNOSIS — M5416 Radiculopathy, lumbar region: Secondary | ICD-10-CM

## 2023-10-05 DIAGNOSIS — M47816 Spondylosis without myelopathy or radiculopathy, lumbar region: Secondary | ICD-10-CM

## 2023-10-12 ENCOUNTER — Encounter: Payer: Self-pay | Admitting: Orthopaedic Surgery

## 2023-10-24 DIAGNOSIS — M545 Low back pain, unspecified: Secondary | ICD-10-CM | POA: Diagnosis not present

## 2023-10-26 ENCOUNTER — Other Ambulatory Visit: Payer: BC Managed Care – PPO

## 2023-10-27 DIAGNOSIS — M47816 Spondylosis without myelopathy or radiculopathy, lumbar region: Secondary | ICD-10-CM | POA: Diagnosis not present

## 2023-10-27 DIAGNOSIS — Z6821 Body mass index (BMI) 21.0-21.9, adult: Secondary | ICD-10-CM | POA: Diagnosis not present
